# Patient Record
Sex: Male | Born: 2000 | Race: White | Hispanic: No | Marital: Single | Smoking: Current every day smoker
Health system: Southern US, Community
[De-identification: ages and names within clinical notes are randomized; demographics above are authoritative.]

## PROBLEM LIST (undated history)

## (undated) DIAGNOSIS — J45909 Unspecified asthma, uncomplicated: Secondary | ICD-10-CM

## (undated) DIAGNOSIS — T7840XA Allergy, unspecified, initial encounter: Secondary | ICD-10-CM

## (undated) DIAGNOSIS — F909 Attention-deficit hyperactivity disorder, unspecified type: Secondary | ICD-10-CM

## (undated) HISTORY — DX: Attention-deficit hyperactivity disorder, unspecified type: F90.9

## (undated) HISTORY — PX: TONSILLECTOMY AND ADENOIDECTOMY: SUR1326

## (undated) HISTORY — DX: Allergy, unspecified, initial encounter: T78.40XA

## (undated) HISTORY — DX: Unspecified asthma, uncomplicated: J45.909

---
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
## 2021-07-23 ENCOUNTER — Encounter: Payer: Self-pay | Admitting: Family Medicine

## 2021-07-23 ENCOUNTER — Ambulatory Visit (INDEPENDENT_AMBULATORY_CARE_PROVIDER_SITE_OTHER): Payer: Medicaid Other | Admitting: Family Medicine

## 2021-07-23 VITALS — BP 99/55 | HR 59 | Temp 98.0°F | Ht 68.5 in | Wt 138.4 lb

## 2021-07-23 DIAGNOSIS — Z0000 Encounter for general adult medical examination without abnormal findings: Secondary | ICD-10-CM | POA: Diagnosis not present

## 2021-07-23 DIAGNOSIS — Z23 Encounter for immunization: Secondary | ICD-10-CM

## 2021-07-23 DIAGNOSIS — Z840 Family history of diseases of the skin and subcutaneous tissue: Secondary | ICD-10-CM | POA: Diagnosis not present

## 2021-07-23 DIAGNOSIS — E559 Vitamin D deficiency, unspecified: Secondary | ICD-10-CM | POA: Diagnosis not present

## 2021-07-23 DIAGNOSIS — R809 Proteinuria, unspecified: Secondary | ICD-10-CM | POA: Diagnosis not present

## 2021-07-23 LAB — URINALYSIS, ROUTINE W REFLEX MICROSCOPIC
Bilirubin, UA: NEGATIVE
Glucose, UA: NEGATIVE
Ketones, UA: NEGATIVE
Leukocytes,UA: NEGATIVE
Nitrite, UA: NEGATIVE
RBC, UA: NEGATIVE
Specific Gravity, UA: >1.03 — ABNORMAL HIGH (ref 1.005–1.030)
Urobilinogen, Ur: 0.2 mg/dL (ref 0.2–1.0)
pH, UA: 6.5 (ref 5.0–7.5)

## 2021-07-23 LAB — MICROSCOPIC EXAMINATION
Epithelial Cells (non renal): NONE SEEN /hpf (ref 0–10)
RBC, Urine: NONE SEEN /hpf (ref 0–2)
WBC, UA: NONE SEEN /hpf (ref 0–5)

## 2021-07-23 MED ORDER — ALBUTEROL SULFATE HFA 108 (90 BASE) MCG/ACT IN AERS: 2.0000 | INHALATION_SPRAY | Freq: Four times a day (QID) | RESPIRATORY_TRACT | 0 refills | Status: AC | PRN

## 2021-07-23 NOTE — Addendum Note (Signed)
Addended by: Valerie Roys on: 07/23/2021 01:32 PM ? ? Modules accepted: Orders ? ?

## 2021-07-23 NOTE — Progress Notes (Addendum)
? ?BP (!) 99/55   Pulse (!) 59   Temp 98 ?F (36.7 ?C)   Ht 5' 8.5" (1.74 m)   Wt 138 lb 6.4 oz (62.8 kg)   SpO2 99%   BMI 20.74 kg/m?   ? ?Subjective:  ? ? Patient ID: Gilbert Adams PASSAGE, male    DOB: 2000-07-18, 21 y.o.   MRN: [id number] ? ?HPI: ?Gilbert Adams is a 21 y.o. male presenting on 07/23/2021 for comprehensive medical examination. Current medical complaints include:none ? ?Interim Problems from his last visit: no ? ?Depression Screen done today and results listed below:  ? ?  07/23/2021  ? 11:18 AM 02/13/2020  ? 11:27 AM 10/15/2018  ?  2:15 PM 04/04/2017  ?  2:26 PM  ?Depression screen PHQ 2/9  ?Decreased Interest '1 2 1 1  '$ ?Down, Depressed, Hopeless 0 2 0   ?PHQ - 2 Score '1 4 1 1  '$ ?Altered sleeping '1 1 1 '$ 0  ?Tired, decreased energy 0 '1 1 1  '$ ?Change in appetite 1 3 0 1  ?Feeling bad or failure about yourself  0 0 0 0  ?Trouble concentrating 1 0 2 2  ?Moving slowly or fidgety/restless 0 1 1 0  ?Suicidal thoughts 0 0 0 0  ?PHQ-9 Score '4 10 6 5  '$ ?Difficult doing work/chores Not difficult at all Somewhat difficult Somewhat difficult   ? ? ?Past Medical History:  ?Past Medical History:  ?Diagnosis Date  ? 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 ADHD (attention deficit hyperactivity disorder)   ? 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 Allergy   ? Asthma   ? ? ?Surgical History:  ?Past Surgical History:  ?Procedure Laterality Date  ? TONSILLECTOMY AND ADENOIDECTOMY    ? ? ?Medications:  ?No current outpatient medications on file prior to visit.  ? ?No current facility-administered medications on file prior to visit.  ? ? ?Allergies:  ?No Known Allergies ? ?Social History:  ?Social History  ? ?Socioeconomic History  ? Marital status: Single  ?  Spouse name: Not on file  ? Number of children: Not on file  ? Years of education: Not on file  ? Highest education level: Not on file  ?Occupational History  ? Not on file  ?Tobacco Use  ? Smoking status: Some Days  ?  Types: Cigars  ?  Smokeless tobacco: Never  ?Vaping Use  ? Vaping Use: Former  ?Substance and Sexual Activity  ? Alcohol use: Yes  ?  Comment: on occasions  ? Drug use: Yes  ?  Types: Marijuana  ?  Comment: occassionally 2-3x a week   ? Sexual activity: Yes  ?  Birth control/protection: Condom  ?Other Topics Concern  ? Not on file  ?Social History Narrative  ? Not on file  ? ?Social Determinants of Health  ? ?Financial Resource Strain: Not on file  ?Food Insecurity: Not on file  ?Transportation Needs: Not on file  ?Physical Activity: Not on file  ?Stress: Not on file  ?Social Connections: Not on file  ?Intimate Partner Violence: Not on file  ? ?Social History  ? ?Tobacco Use  ?Smoking Status Some Days  ? Types: Cigars  ?Smokeless Tobacco Never  ? ?Social History  ? ?Substance and Sexual Activity  ?Alcohol Use Yes  ? Comment: on occasions  ? ? ?Family History:  ?Family History  ?Problem Relation Age of Onset  ? Anxiety disorder Mother   ? Lupus Father   ? Lung disease  Maternal Grandmother   ? ? ?Past medical history, surgical history, medications, allergies, family history and social history reviewed with patient today and changes made to appropriate areas of the chart.  ? ?Review of Systems  ?Constitutional:  Positive for diaphoresis. Negative for chills, fever, malaise/fatigue and weight loss.  ?HENT: Negative.    ?Eyes: Negative.   ?Respiratory: Negative.    ?Cardiovascular:  Positive for chest pain (with smoking) and palpitations. Negative for orthopnea, claudication, leg swelling and PND.  ?Gastrointestinal: Negative.   ?Genitourinary: Negative.   ?Musculoskeletal: Negative.   ?Skin: Negative.   ?Neurological: Negative.   ?Endo/Heme/Allergies: Negative.   ?Psychiatric/Behavioral: Negative.    ?All other ROS negative except what is listed above and in the HPI.  ? ?   ?Objective:  ?  ?BP (!) 99/55   Pulse (!) 59   Temp 98 ?F (36.7 ?C)   Ht 5' 8.5" (1.74 m)   Wt 138 lb 6.4 oz (62.8 kg)   SpO2 99%   BMI 20.74 kg/m?   ?Wt  Readings from Last 3 Encounters:  ?07/23/21 138 lb 6.4 oz (62.8 kg)  ?07/30/20 144 lb (65.3 kg) (34 %, Z= -0.42)*  ?06/25/20 140 lb (63.5 kg) (27 %, Z= -0.60)*  ? ?* Growth percentiles are based on CDC (Boys, 2-20 Years) data.  ?  ?Physical Exam ?Vitals and nursing note reviewed.  ?Constitutional:   ?   General: He is not in acute distress. ?   Appearance: Normal appearance. He is normal weight. He is not ill-appearing, toxic-appearing or diaphoretic.  ?HENT:  ?   Head: Normocephalic and atraumatic.  ?   Right Ear: Tympanic membrane, ear canal and external ear normal. There is no impacted cerumen.  ?   Left Ear: Tympanic membrane, ear canal and external ear normal. There is no impacted cerumen.  ?   Nose: Nose normal. No congestion or rhinorrhea.  ?   Mouth/Throat:  ?   Mouth: Mucous membranes are moist.  ?   Pharynx: Oropharynx is clear. No oropharyngeal exudate or posterior oropharyngeal erythema.  ?Eyes:  ?   General: No scleral icterus.    ?   Right eye: No discharge.     ?   Left eye: No discharge.  ?   Extraocular Movements: Extraocular movements intact.  ?   Conjunctiva/sclera: Conjunctivae normal.  ?   Pupils: Pupils are equal, round, and reactive to light.  ?Neck:  ?   Vascular: No carotid bruit.  ?Cardiovascular:  ?   Rate and Rhythm: Normal rate and regular rhythm.  ?   Pulses: Normal pulses.  ?   Heart sounds: No murmur heard. ?  No friction rub. No gallop.  ?Pulmonary:  ?   Effort: Pulmonary effort is normal. No respiratory distress.  ?   Breath sounds: Normal breath sounds. No stridor. No wheezing, rhonchi or rales.  ?Chest:  ?   Chest wall: No tenderness.  ?Abdominal:  ?   General: Abdomen is flat. Bowel sounds are normal. There is no distension.  ?   Palpations: Abdomen is soft. There is no mass.  ?   Tenderness: There is no abdominal tenderness. There is no right CVA tenderness, left CVA tenderness, guarding or rebound.  ?   Hernia: No hernia is present.  ?Genitourinary: ?   Comments: Genital exam  deferred with shared decision making ?Musculoskeletal:     ?   General: No swelling, tenderness, deformity or signs of injury.  ?   Cervical back: Normal  range of motion and neck supple. No rigidity. No muscular tenderness.  ?   Right lower leg: No edema.  ?   Left lower leg: No edema.  ?Lymphadenopathy:  ?   Cervical: No cervical adenopathy.  ?Skin: ?   General: Skin is warm and dry.  ?   Capillary Refill: Capillary refill takes less than 2 seconds.  ?   Coloration: Skin is not jaundiced or pale.  ?   Findings: No bruising, erythema, lesion or rash.  ?Neurological:  ?   General: No focal deficit present.  ?   Mental Status: He is alert and oriented to person, place, and time.  ?   Cranial Nerves: No cranial nerve deficit.  ?   Sensory: No sensory deficit.  ?   Motor: No weakness.  ?   Coordination: Coordination normal.  ?   Gait: Gait normal.  ?   Deep Tendon Reflexes: Reflexes normal.  ?Psychiatric:     ?   Mood and Affect: Mood normal.     ?   Behavior: Behavior normal.     ?   Thought Content: Thought content normal.     ?   Judgment: Judgment normal.  ? ? ?Results for orders placed or performed in visit on 07/23/21  ?Microscopic Examination  ? Urine  ?Result Value Ref Range  ? WBC, UA None seen 0 - 5 /hpf  ? RBC None seen 0 - 2 /hpf  ? Epithelial Cells (non renal) None seen 0 - 10 /hpf  ? Mucus, UA Present (A) Not Estab.  ?Urinalysis, Routine w reflex microscopic  ?Result Value Ref Range  ? Specific Gravity, UA >1.030 (H) 1.005 - 1.030  ? pH, UA 6.5 5.0 - 7.5  ? Color, UA Yellow Yellow  ? Appearance Ur Cloudy (A) Clear  ? Leukocytes,UA Negative Negative  ? Protein,UA 2+ (A) Negative/Trace  ? Glucose, UA Negative Negative  ? Ketones, UA Negative Negative  ? RBC, UA Negative Negative  ? Bilirubin, UA Negative Negative  ? Urobilinogen, Ur 0.2 0.2 - 1.0 mg/dL  ? Nitrite, UA Negative Negative  ? Microscopic Examination See below:   ? ?   ?Assessment & Plan:  ? ?Problem List Items Addressed This Visit   ?None ?Visit  Diagnoses   ? ? Routine general medical examination at a health care facility    -  Primary  ? Vaccines to be updated. Screening labs checked today. Continue diet and exercise. Call with any concerns. Continue to monitor.

## 2021-07-24 LAB — HCV INTERPRETATION

## 2021-07-24 LAB — ANA: Anti Nuclear Antibody (ANA): NEGATIVE

## 2021-07-24 LAB — ACUTE VIRAL HEPATITIS (HAV, HBV, HCV)
HCV Ab: NONREACTIVE
Hep A IgM: NEGATIVE
Hep B C IgM: NEGATIVE
Hepatitis B Surface Ag: NEGATIVE

## 2021-07-25 LAB — CBC WITH DIFFERENTIAL/PLATELET
Basophils Absolute: 0 10*3/uL (ref 0.0–0.2)
Basos: 1 %
EOS (ABSOLUTE): 0.2 10*3/uL (ref 0.0–0.4)
Eos: 2 %
Hematocrit: 40.9 % (ref 37.5–51.0)
Hemoglobin: 13.9 g/dL (ref 13.0–17.7)
Immature Grans (Abs): 0 10*3/uL (ref 0.0–0.1)
Immature Granulocytes: 0 %
Lymphocytes Absolute: 2.4 10*3/uL (ref 0.7–3.1)
Lymphs: 37 %
MCH: 31.3 pg (ref 26.6–33.0)
MCHC: 34 g/dL (ref 31.5–35.7)
MCV: 92 fL (ref 79–97)
Monocytes Absolute: 0.5 10*3/uL (ref 0.1–0.9)
Monocytes: 7 %
Neutrophils Absolute: 3.4 10*3/uL (ref 1.4–7.0)
Neutrophils: 53 %
Platelets: 206 10*3/uL (ref 150–450)
RBC: 4.44 x10E6/uL (ref 4.14–5.80)
RDW: 12.3 % (ref 11.6–15.4)
WBC: 6.6 10*3/uL (ref 3.4–10.8)

## 2021-07-25 LAB — HIV ANTIBODY (ROUTINE TESTING W REFLEX): HIV Screen 4th Generation wRfx: NONREACTIVE

## 2021-07-25 LAB — COMPREHENSIVE METABOLIC PANEL
ALT: 60 IU/L — ABNORMAL HIGH (ref 0–44)
AST: 134 IU/L — ABNORMAL HIGH (ref 0–40)
Albumin/Globulin Ratio: 1.9 (ref 1.2–2.2)
Albumin: 4.8 g/dL (ref 4.1–5.2)
Alkaline Phosphatase: 56 IU/L (ref 51–125)
BUN/Creatinine Ratio: 10 (ref 9–20)
BUN: 10 mg/dL (ref 6–20)
Bilirubin Total: 0.3 mg/dL (ref 0.0–1.2)
CO2: 23 mmol/L (ref 20–29)
Calcium: 9.7 mg/dL (ref 8.7–10.2)
Chloride: 102 mmol/L (ref 96–106)
Creatinine, Ser: 0.98 mg/dL (ref 0.76–1.27)
Globulin, Total: 2.5 g/dL (ref 1.5–4.5)
Glucose: 83 mg/dL (ref 70–99)
Potassium: 4.2 mmol/L (ref 3.5–5.2)
Sodium: 140 mmol/L (ref 134–144)
Total Protein: 7.3 g/dL (ref 6.0–8.5)
eGFR: 113 mL/min/{1.73_m2} (ref >59)

## 2021-07-25 LAB — HSV(HERPES SIMPLEX VRS) I + II AB-IGG
HSV 1 Glycoprotein G Ab, IgG: <0.91 index (ref 0.00–0.90)
HSV 2 IgG, Type Spec: 7.95 index — ABNORMAL HIGH (ref 0.00–0.90)

## 2021-07-25 LAB — LIPID PANEL W/O CHOL/HDL RATIO
Cholesterol, Total: 125 mg/dL (ref 100–199)
HDL: 44 mg/dL (ref >39)
LDL Chol Calc (NIH): 64 mg/dL (ref 0–99)
Triglycerides: 89 mg/dL (ref 0–149)
VLDL Cholesterol Cal: 17 mg/dL (ref 5–40)

## 2021-07-25 LAB — RPR: RPR Ser Ql: NONREACTIVE

## 2021-07-25 LAB — TSH: TSH: 2.72 u[IU]/mL (ref 0.450–4.500)

## 2021-07-25 LAB — VITAMIN D 25 HYDROXY (VIT D DEFICIENCY, FRACTURES): Vit D, 25-Hydroxy: 17.6 ng/mL — ABNORMAL LOW (ref 30.0–100.0)

## 2021-07-26 ENCOUNTER — Other Ambulatory Visit: Payer: Self-pay | Admitting: Family Medicine

## 2021-07-26 MED ORDER — VITAMIN D (ERGOCALCIFEROL) 1.25 MG (50000 UNIT) PO CAPS: 50000.0000 [IU] | ORAL_CAPSULE | ORAL | 1 refills | Status: AC

## 2021-07-27 LAB — GC/CHLAMYDIA PROBE AMP
Chlamydia trachomatis, NAA: NEGATIVE
Neisseria Gonorrhoeae by PCR: NEGATIVE

## 2021-08-30 ENCOUNTER — Ambulatory Visit: Payer: Medicaid Other

## 2022-07-26 ENCOUNTER — Ambulatory Visit: Payer: Medicaid Other | Admitting: Family Medicine

## 2022-08-30 ENCOUNTER — Telehealth: Payer: Self-pay

## 2022-08-30 NOTE — Telephone Encounter (Signed)
LVM for patient to call back [phone], or to call PCP office to schedule follow up apt. AS, CMA  

## 2022-12-14 ENCOUNTER — Encounter (HOSPITAL_COMMUNITY): Payer: Self-pay

## 2022-12-14 ENCOUNTER — Ambulatory Visit (HOSPITAL_COMMUNITY)
Admission: EM | Admit: 2022-12-14 | Discharge: 2022-12-14 | Disposition: A | Discharge status: Home / Self Care | Payer: Medicaid Other

## 2022-12-14 DIAGNOSIS — M940 Chondrocostal junction syndrome [Tietze]: Secondary | ICD-10-CM

## 2022-12-14 NOTE — ED Provider Notes (Addendum)
[facility]    CSN: [id number] Arrival date & time: 12/14/22  5956      History   Chief Complaint Chief Complaint  Patient presents with   Rib Injury    HPI Gilbert Adams is a 22 y.o. male.   Patient presents to clinic with right anterior rib cage pain.  Noticed discomfort and soreness started after sitting against an armrest for 20-30 min at a friends house. This morning he felt like it was harder to breathe and to move, as this causes pain.   He was on the way to clinic when about 5 minutes prior to arrival reports he had a panic attack and had to calm himself down due to the pain and how short of breath he felt.  No trauma.  Has not tried any interventions or taken any medications for pain. Reports he is a chronic smoker.     The history is provided by the patient and medical records.    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 Authorizing Provider  albuterol (VENTOLIN HFA) 108 (90 Base) MCG/ACT inhaler Inhale 2 puffs into the lungs every 6 (six) hours as needed for wheezing or shortness of breath. 07/23/21  Yes Johnson, Megan P, DO  Vitamin D, Ergocalciferol, (DRISDOL) 1.25 MG (50000 UNIT) CAPS capsule Take 1 capsule (50,000 Units total) by mouth every 7 (seven) days. 07/26/21   Dorcas Carrow, DO    Family History Family History  Problem Relation Age of Onset   Anxiety disorder Mother    Lupus Father    Lung disease Maternal Grandmother     Social History Social History   Tobacco Use   Smoking status: Every Day    Types: Cigars   Smokeless tobacco: Never  Vaping Use   Vaping status: Former  Substance Use Topics   Alcohol use: Yes    Comment: on occasions   Drug use: Yes    Types: Marijuana    Comment: occassionally 2-3x a week      Allergies   Patient has no known allergies.   Review of Systems Review of Systems  Respiratory:  Positive for shortness of breath. Negative for cough and wheezing.   Cardiovascular:  Positive for chest pain.     Physical Exam Triage Vital Signs ED Triage Vitals  Encounter Vitals Group     BP 12/14/22 0856 115/78     Systolic BP Percentile --      Diastolic BP Percentile --      Pulse Rate 12/14/22 0856 (!) 58     Resp 12/14/22 0856 16     Temp 12/14/22 0856 98.3 F (36.8 C)     Temp Source 12/14/22 0856 Oral     SpO2 12/14/22 0856 98 %  Weight 12/14/22 0856 140 lb (63.5 kg)     Height 12/14/22 0856 5\' 8"  (1.727 m)     Head Circumference --      Peak Flow --      Pain Score 12/14/22 0855 7     Pain Loc --      Pain Education --      Exclude from Growth Chart --    No data found.  Updated Vital Signs BP 115/78 (BP Location: Left Arm)   Pulse (!) 58   Temp 98.3 F (36.8 C) (Oral)   Resp 16   Ht 5\' 8"  (1.727 m)   Wt 140 lb (63.5 kg)   SpO2 98%   BMI 21.29 kg/m   Visual Acuity Right Eye Distance:   Left Eye Distance:   Bilateral Distance:    Right Eye Near:   Left Eye Near:    Bilateral Near:     Physical Exam Vitals and nursing note reviewed.  Constitutional:      Appearance: Normal appearance.  HENT:     Head: Normocephalic and atraumatic.     Right Ear: External ear normal.     Left Ear: External ear normal.     Nose: Nose normal.     Mouth/Throat:     Mouth: Mucous  membranes are moist.  Eyes:     General: No scleral icterus. Cardiovascular:     Rate and Rhythm: Normal rate and regular rhythm.     Heart sounds: Normal heart sounds. No murmur heard. Pulmonary:     Effort: Pulmonary effort is normal. No respiratory distress.     Breath sounds: Normal breath sounds.  Chest:     Chest wall: No deformity, swelling, tenderness or crepitus.       Comments: Right sided chest wall pain with inspiration, area is non-TTP. No crepitus, bruising or deformity.  Musculoskeletal:        General: No swelling, tenderness, deformity or signs of injury. Normal range of motion.  Skin:    General: Skin is warm and dry.  Neurological:     General: No focal deficit present.     Mental Status: He is alert and oriented to person, place, and time.  Psychiatric:        Mood and Affect: Mood normal.        Behavior: Behavior normal. Behavior is cooperative.      UC Treatments / Results  Labs (all labs ordered are listed, but only abnormal results are displayed) Labs Reviewed - No data to display  EKG   Radiology No results found.  Procedures Procedures (including critical care time)  Medications Ordered in UC Medications - No data to display  Initial Impression / Assessment and Plan / UC Course  I have reviewed the triage vital signs and the nursing notes.  Pertinent labs & imaging results that were available during my care of the patient were reviewed by me and considered in my medical decision making (see chart for details).  Vitals and triage reviewed, patient is hemodynamically stable.  Patient has right-sided chest wall pain with deep inspiration.  Lungs are vesicular.  Area is nontender to palpation, no obvious deformity, crepitus or bruising.  Discussed that imaging is not indicated due to lack of trauma and lack of tenderness with palpation.  Suspect anxiety is contributing to SOB. Advised anti-inflammatories as needed, ibuprofen 800 mg every 8  hours for pain.  Ortho follow-up if symptoms persist. Patient elected to leave prior to discharge  papers being pregnant, discussed plan of care, follow-up care and return precautions verbally.     Final Clinical Impressions(s) / UC Diagnoses   Final diagnoses:  Costochondritis   Discharge Instructions   None    ED Prescriptions   None    PDMP not reviewed this encounter.       Rinaldo Ratel [place], [place] 12/14/22 [phone]

## 2022-12-14 NOTE — ED Triage Notes (Signed)
Patient here today with c/o right side rib pain and difficulty breathing since last night. Patient states that he was leaning over to the right side while sitting on a couch on an arm rest.
